# Patient Record
Sex: Male | Born: 1975 | Race: White | Hispanic: No | Marital: Married | Smoking: Current every day smoker
Health system: Southern US, Community
[De-identification: ages and names within clinical notes are randomized; demographics above are authoritative.]

## PROBLEM LIST (undated history)

## (undated) ENCOUNTER — Emergency Department (HOSPITAL_COMMUNITY): Payer: 59

## (undated) DIAGNOSIS — G43909 Migraine, unspecified, not intractable, without status migrainosus: Secondary | ICD-10-CM

---

## 2002-12-24 ENCOUNTER — Emergency Department (HOSPITAL_COMMUNITY): Admission: EM | Admit: 2002-12-24 | Discharge: 2002-12-24 | Payer: Self-pay | Admitting: Emergency Medicine

## 2005-03-09 ENCOUNTER — Emergency Department (HOSPITAL_COMMUNITY): Admission: EM | Admit: 2005-03-09 | Discharge: 2005-03-09 | Payer: Self-pay | Admitting: Emergency Medicine

## 2005-04-25 ENCOUNTER — Emergency Department (HOSPITAL_COMMUNITY): Admission: EM | Admit: 2005-04-25 | Discharge: 2005-04-25 | Payer: Self-pay | Admitting: Family Medicine

## 2007-01-26 IMAGING — CR DG FOOT COMPLETE 3+V*L*
4 series · 4 of 4 positions shown · non-contrast
Comparison: none

CLINICAL DATA: Left foot injury with pain and swelling.  
 3-VIEW LEFT FOOT:

[t foot ap left]
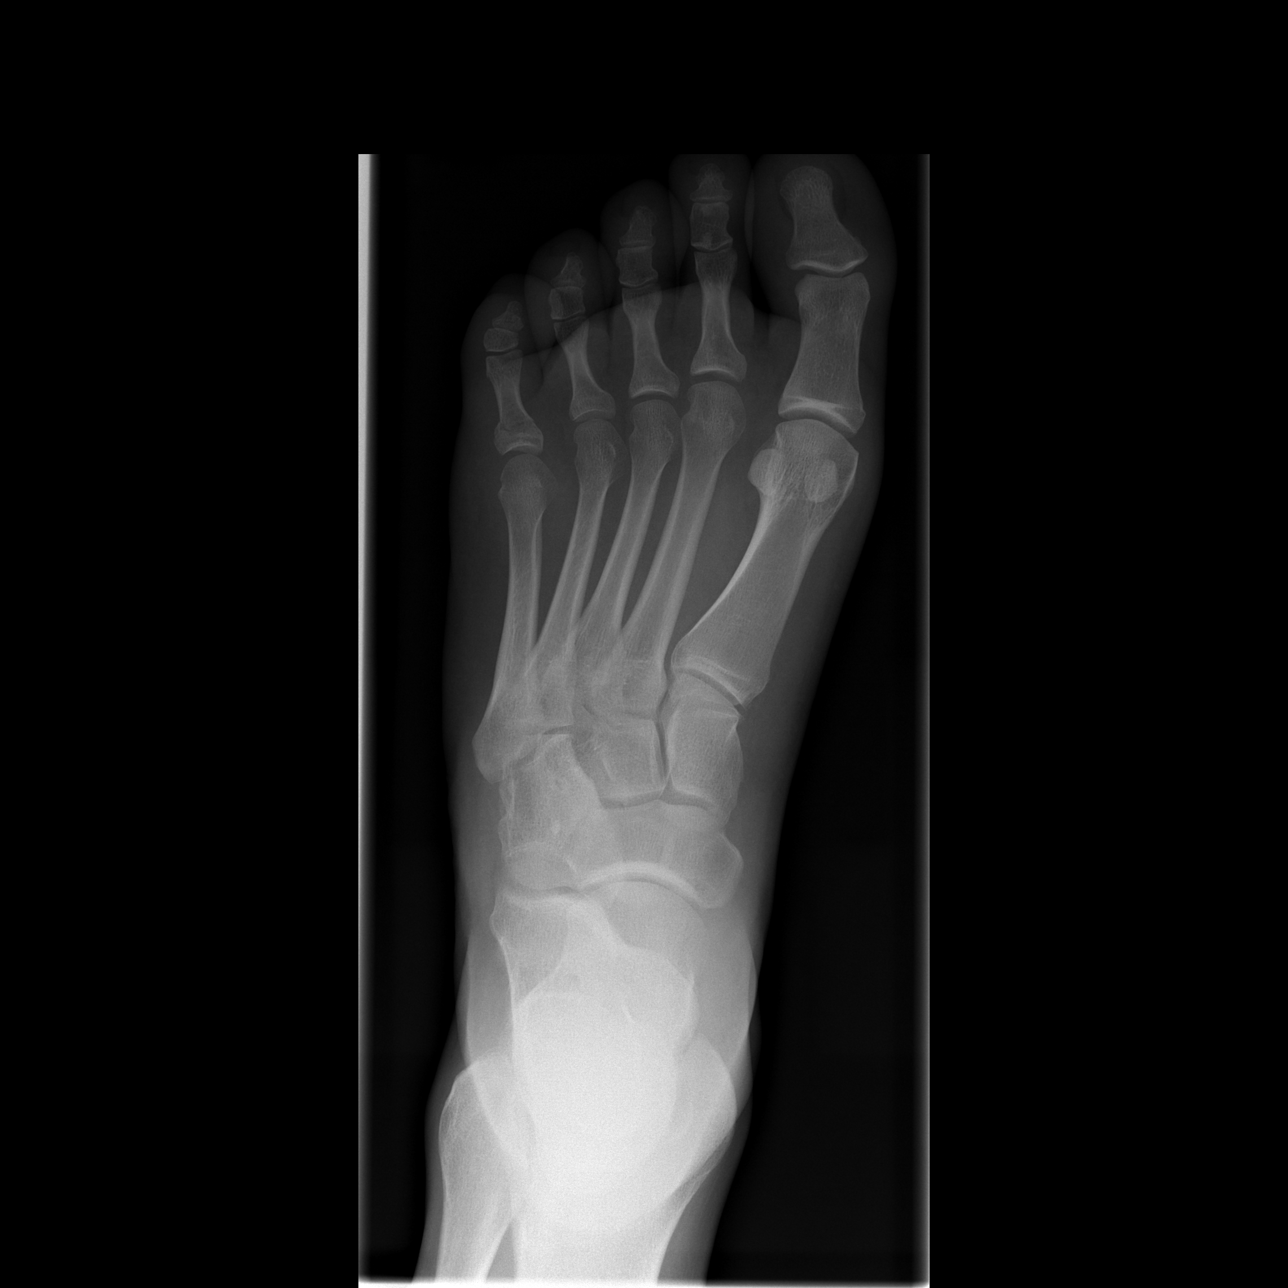

[t foot oblique left]
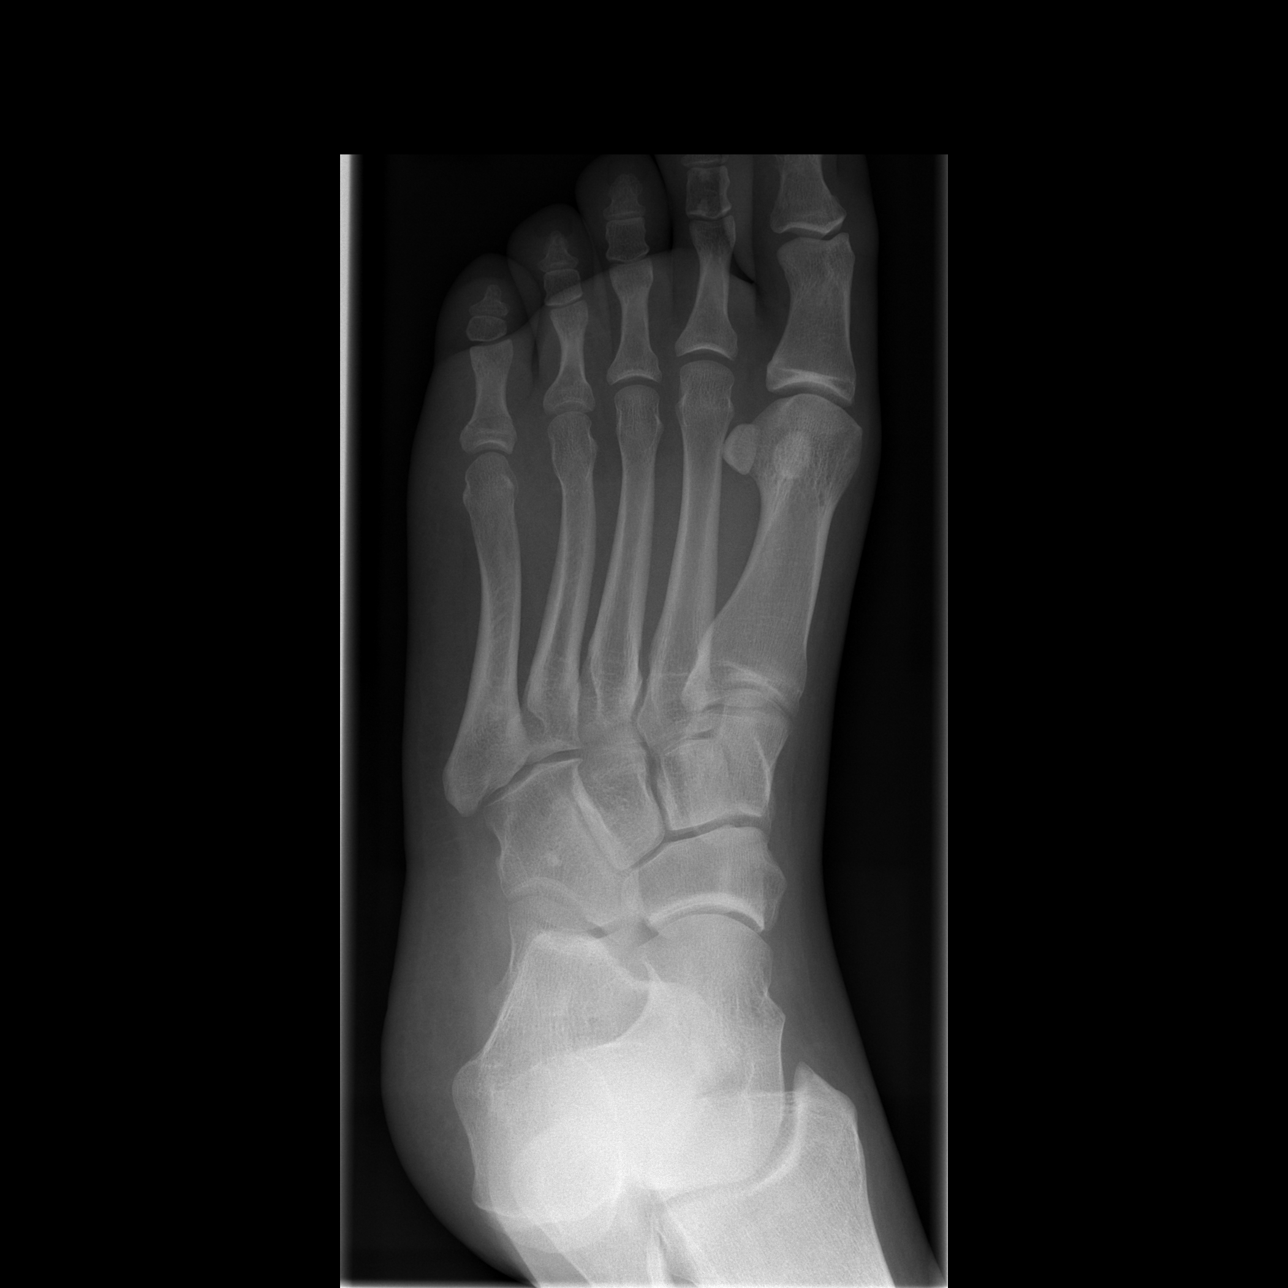

[t foot lat left (1 of 2)]
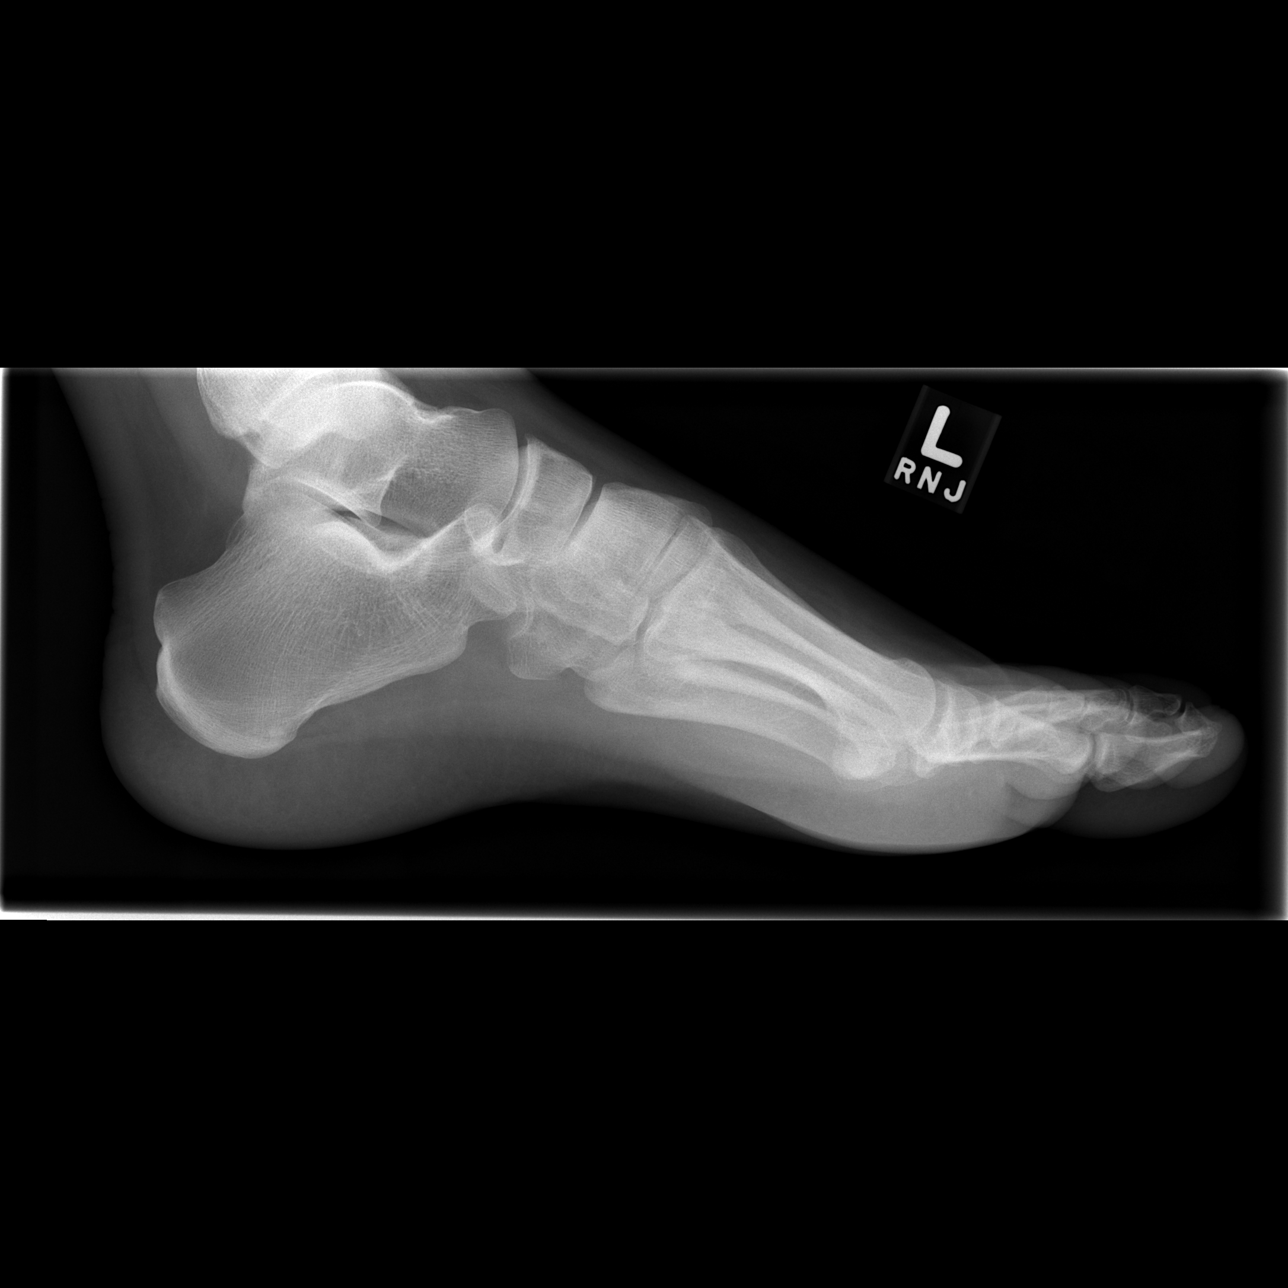

[t foot lat left (2 of 2)]
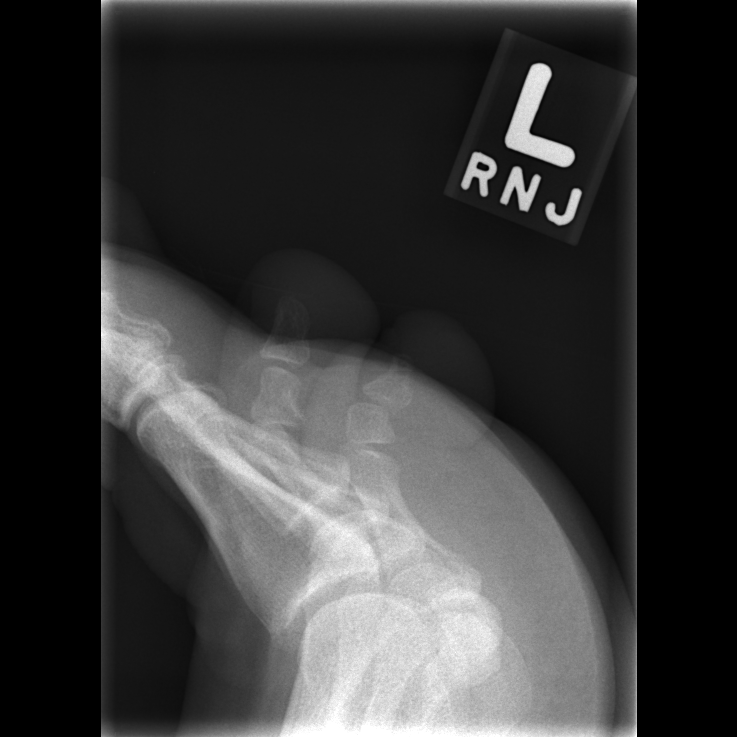

[4 of 4 positions shown; findings below may reference images not displayed]

FINDINGS: There is a nondisplaced fracture at the base of the proximal phalanx of the little toe.  There is no evidence of subluxation or dislocation.  No other significant abnormalities are identified.
IMPRESSION: Nondisplaced fracture of the base of the fifth proximal phalanx.

## 2007-09-24 ENCOUNTER — Emergency Department (HOSPITAL_BASED_OUTPATIENT_CLINIC_OR_DEPARTMENT_OTHER): Admission: EM | Admit: 2007-09-24 | Discharge: 2007-09-24 | Payer: Self-pay | Admitting: Emergency Medicine

## 2007-09-25 ENCOUNTER — Emergency Department (HOSPITAL_BASED_OUTPATIENT_CLINIC_OR_DEPARTMENT_OTHER): Admission: EM | Admit: 2007-09-25 | Discharge: 2007-09-25 | Payer: Self-pay | Admitting: Emergency Medicine

## 2008-11-15 ENCOUNTER — Emergency Department (HOSPITAL_BASED_OUTPATIENT_CLINIC_OR_DEPARTMENT_OTHER): Admission: EM | Admit: 2008-11-15 | Discharge: 2008-11-15 | Payer: Self-pay | Admitting: Emergency Medicine

## 2014-01-22 ENCOUNTER — Emergency Department (HOSPITAL_BASED_OUTPATIENT_CLINIC_OR_DEPARTMENT_OTHER)
Admission: EM | Admit: 2014-01-22 | Discharge: 2014-01-22 | Disposition: A | Discharge status: Home / Self Care | Payer: 59 | Attending: Emergency Medicine | Admitting: Emergency Medicine

## 2014-01-22 ENCOUNTER — Encounter (HOSPITAL_BASED_OUTPATIENT_CLINIC_OR_DEPARTMENT_OTHER): Payer: Self-pay | Admitting: *Deleted

## 2014-01-22 DIAGNOSIS — Z720 Tobacco use: Secondary | ICD-10-CM | POA: Insufficient documentation

## 2014-01-22 DIAGNOSIS — R05 Cough: Secondary | ICD-10-CM | POA: Insufficient documentation

## 2014-01-22 DIAGNOSIS — Z79899 Other long term (current) drug therapy: Secondary | ICD-10-CM | POA: Diagnosis not present

## 2014-01-22 DIAGNOSIS — G43809 Other migraine, not intractable, without status migrainosus: Secondary | ICD-10-CM | POA: Insufficient documentation

## 2014-01-22 DIAGNOSIS — G43909 Migraine, unspecified, not intractable, without status migrainosus: Secondary | ICD-10-CM | POA: Diagnosis present

## 2014-01-22 HISTORY — DX: Migraine, unspecified, not intractable, without status migrainosus: G43.909

## 2014-01-22 MED ORDER — SODIUM CHLORIDE 0.9 % IV BOLUS (SEPSIS)
1000.0000 mL | Freq: Once | INTRAVENOUS | Status: AC
  Administered 2014-01-22: 1000 mL via INTRAVENOUS

## 2014-01-22 MED ORDER — PROMETHAZINE HCL 25 MG/ML IJ SOLN
12.5000 mg | Freq: Once | INTRAMUSCULAR | Status: AC
  Administered 2014-01-22: 12.5 mg via INTRAVENOUS
  Filled 2014-01-22: qty 1

## 2014-01-22 MED ORDER — SODIUM CHLORIDE 0.9 % IV SOLN: INTRAVENOUS | Status: DC

## 2014-01-22 MED ORDER — DIPHENHYDRAMINE HCL 50 MG/ML IJ SOLN
25.0000 mg | Freq: Once | INTRAMUSCULAR | Status: AC
  Administered 2014-01-22: 25 mg via INTRAVENOUS
  Filled 2014-01-22: qty 1

## 2014-01-22 MED ORDER — DEXAMETHASONE SODIUM PHOSPHATE 10 MG/ML IJ SOLN
10.0000 mg | Freq: Once | INTRAMUSCULAR | Status: AC
  Administered 2014-01-22: 10 mg via INTRAVENOUS
  Filled 2014-01-22: qty 1

## 2014-01-22 MED ORDER — KETOROLAC TROMETHAMINE 30 MG/ML IJ SOLN
30.0000 mg | Freq: Once | INTRAMUSCULAR | Status: AC
Start: 2014-01-22 — End: 2014-01-22
  Administered 2014-01-22: 30 mg via INTRAVENOUS
  Filled 2014-01-22: qty 1

## 2014-01-22 NOTE — ED Notes (Signed)
Migraine headache since last night. Light and noise sensitive.

## 2014-01-22 NOTE — Discharge Instructions (Signed)
Migraine Headache A migraine headache is very bad, throbbing pain on one or both sides of your head. Talk to your doctor about what things may bring on (trigger) your migraine headaches. HOME CARE  Only take medicines as told by your doctor.  Lie down in a dark, quiet room when you have a migraine.  Keep a journal to find out if certain things bring on migraine headaches. For example, write down:  What you eat and drink.  How much sleep you get.  Any change to your diet or medicines.  Lessen how much alcohol you drink.  Quit smoking if you smoke.  Get enough sleep.  Lessen any stress in your life.  Keep lights dim if bright lights bother you or make your migraines worse. GET HELP RIGHT AWAY IF:   Your migraine becomes really bad.  You have a fever.  You have a stiff neck.  You have trouble seeing.  Your muscles are weak, or you lose muscle control.  You lose your balance or have trouble walking.  You feel like you will pass out (faint), or you pass out.  You have really bad symptoms that are different than your first symptoms. MAKE SURE YOU:   Understand these instructions.  Will watch your condition.  Will get help right away if you are not doing well or get worse. Document Released: 11/25/2007 Document Revised: 05/10/2011 Document Reviewed: 10/23/2012 [facility]ExitCare Patient Information 2015 [place], [place]. This information is not intended to replace advice given to you by your health care provider. Make sure you discuss any questions you have with your health care provider.  Rest and work note provided. Return for any new or worse symptoms. Can resume your migraine medicines at home. Make an appoint follow up with your regular Dr. if symptoms persist.

## 2014-01-22 NOTE — ED Provider Notes (Signed)
CSN: [id number]     Arrival date & time 01/22/14  1851 History  This chart was scribed for Corey Gutierrez Signora Zucco, MD by Annye AsaAnna Dorsett, ED Scribe. This patient was seen in room MH09/MH09 and the patient's care was started at 8:29 PM.    Chief Complaint  Patient presents with  . Migraine   Patient is a 38 y.o. male presenting with migraines. The history is provided by the patient and the spouse. No language interpreter was used.  Migraine This is a chronic problem. The current episode started yesterday. The problem has not changed since onset.Associated symptoms include headaches. Pertinent negatives include no chest pain and no abdominal pain. He has tried nothing for the symptoms.     HPI Comments: Corey CassisJason Gutierrez is a 38 y.o. male with past medical history of migraines presents to the Emergency Department complaining of migraine, beginning last night at 2000. He reports that his pain is throbbing in nature and localized behind his left eye. He reports a history of migraines, generally located behind the right eye. He reports nausea, vomiting, photophobia.   Past Medical History  Diagnosis Date  . Migraine headache    History reviewed. No pertinent past surgical history. No family history on file. History  Substance Use Topics  . Smoking status: Current Every Day Smoker -- 0.50 packs/day    Types: Cigarettes  . Smokeless tobacco: Not on file  . Alcohol Use: No    Review of Systems  Constitutional: Negative for fever and chills.  HENT: Negative for congestion, rhinorrhea and sore throat.   Eyes: Positive for photophobia and visual disturbance.  Respiratory: Positive for cough.   Cardiovascular: Negative for chest pain and leg swelling.  Gastrointestinal: Positive for nausea and vomiting. Negative for abdominal pain and diarrhea.  Genitourinary: Negative for dysuria, frequency and hematuria.  Musculoskeletal: Negative for back pain.  Skin: Negative for rash.  Neurological: Positive for  headaches. Negative for weakness and numbness.  Hematological: Does not bruise/bleed easily.  Psychiatric/Behavioral: Negative for confusion.   Allergies  Review of patient's allergies indicates no known allergies.  Home Medications   Prior to Admission medications   Medication Sig Start Date End Date Taking? Authorizing Provider  Venlafaxine HCl (EFFEXOR PO) Take by mouth.   Yes Historical Provider, MD   BP 133/72 mmHg  Pulse 76  Temp(Src) 98.2 F (36.8 C) (Oral)  Resp 20  Ht 6' (1.829 m)  Wt 190 lb (86.183 kg)  BMI 25.76 kg/m2  SpO2 99% Physical Exam  Constitutional: He is oriented to person, place, and time. He appears well-developed and well-nourished.  HENT:  Head: Normocephalic and atraumatic.  Eyes: Conjunctivae and EOM are normal. Pupils are equal, round, and reactive to light.  Neck: Neck supple. No tracheal deviation present.  Cardiovascular: Normal rate, regular rhythm and normal heart sounds.  Exam reveals no gallop and no friction rub.   No murmur heard. Pulmonary/Chest: Effort normal and breath sounds normal. He has no wheezes. He has no rales.  Abdominal: Soft. Bowel sounds are normal. There is no tenderness.  Musculoskeletal: He exhibits no edema.  Neurological: He is alert and oriented to person, place, and time.  Skin: Skin is warm and dry.  Psychiatric: He has a normal mood and affect. His behavior is normal.  Nursing note and vitals reviewed.   ED Course  Procedures   DIAGNOSTIC STUDIES: Oxygen Saturation is 100% on RA, normal by my interpretation.    COORDINATION OF CARE: 8:31 PM Discussed treatment plan with  pt at bedside and pt agreed to plan.   Labs Review Labs Reviewed - No data to display  Imaging Review No results found.   EKG Interpretation None      MDM   Final diagnoses:  Other type of migraine   Patient with history of migraines. Patient has chronic migraine meds at home but it was vomiting and could not keep them down.  Headache since yesterday. Associated with nausea vomiting and light sensitivity. Typical for his migraines other than the location is behind the left eye when normally his right eye. Patient got to make migraine cocktail and hydration here. Patient states no real improvement but he was able to rest. Patient clinically seems to be showing signs of improvement. Patient nontoxic, no acute distress no fevers. Patient we discharged home with rest. And can resume his migraine meds. Work note provided.    I personally performed the services described in this documentation, which was scribed in my presence. The recorded information has been reviewed and is accurate.       Corey Gutierrez Danilynn Jemison, MD 01/22/14 2201

## 2014-03-12 ENCOUNTER — Emergency Department (HOSPITAL_BASED_OUTPATIENT_CLINIC_OR_DEPARTMENT_OTHER)
Admission: EM | Admit: 2014-03-12 | Discharge: 2014-03-12 | Disposition: A | Discharge status: Home / Self Care | Payer: 59 | Attending: Emergency Medicine | Admitting: Emergency Medicine

## 2014-03-12 ENCOUNTER — Encounter (HOSPITAL_BASED_OUTPATIENT_CLINIC_OR_DEPARTMENT_OTHER): Payer: Self-pay

## 2014-03-12 DIAGNOSIS — Z79899 Other long term (current) drug therapy: Secondary | ICD-10-CM | POA: Insufficient documentation

## 2014-03-12 DIAGNOSIS — M545 Low back pain: Secondary | ICD-10-CM | POA: Diagnosis present

## 2014-03-12 DIAGNOSIS — Z720 Tobacco use: Secondary | ICD-10-CM | POA: Diagnosis not present

## 2014-03-12 DIAGNOSIS — G43909 Migraine, unspecified, not intractable, without status migrainosus: Secondary | ICD-10-CM | POA: Diagnosis not present

## 2014-03-12 DIAGNOSIS — M5431 Sciatica, right side: Secondary | ICD-10-CM | POA: Diagnosis not present

## 2014-03-12 MED ORDER — OXYCODONE-ACETAMINOPHEN 5-325 MG PO TABS: 1.0000 | ORAL_TABLET | ORAL | Status: AC | PRN

## 2014-03-12 MED ORDER — OXYCODONE-ACETAMINOPHEN 5-325 MG PO TABS
2.0000 | ORAL_TABLET | Freq: Once | ORAL | Status: AC
  Administered 2014-03-12: 2 via ORAL
  Filled 2014-03-12: qty 2

## 2014-03-12 MED ORDER — CYCLOBENZAPRINE HCL 10 MG PO TABS: 10.0000 mg | ORAL_TABLET | Freq: Two times a day (BID) | ORAL | Status: DC | PRN

## 2014-03-12 NOTE — Discharge Instructions (Signed)
Back Exercises Back exercises help treat and prevent back injuries. The goal of back exercises is to increase the strength of your abdominal and back muscles and the flexibility of your back. These exercises should be started when you no longer have back pain. Back exercises include:  Pelvic Tilt. Lie on your back with your knees bent. Tilt your pelvis until the lower part of your back is against the floor. Hold this position 5 to 10 sec and repeat 5 to 10 times.  Knee to Chest. Pull first 1 knee up against your chest and hold for 20 to 30 seconds, repeat this with the other knee, and then both knees. This may be done with the other leg straight or bent, whichever feels better.  Sit-Ups or Curl-Ups. Bend your knees 90 degrees. Start with tilting your pelvis, and do a partial, slow sit-up, lifting your trunk only 30 to 45 degrees off the floor. Take at least 2 to 3 seconds for each sit-up. Do not do sit-ups with your knees out straight. If partial sit-ups are difficult, simply do the above but with only tightening your abdominal muscles and holding it as directed.  Hip-Lift. Lie on your back with your knees flexed 90 degrees. Push down with your feet and shoulders as you raise your hips a couple inches off the floor; hold for 10 seconds, repeat 5 to 10 times.  Back arches. Lie on your stomach, propping yourself up on bent elbows. Slowly press on your hands, causing an arch in your low back. Repeat 3 to 5 times. Any initial stiffness and discomfort should lessen with repetition over time.  Shoulder-Lifts. Lie face down with arms beside your body. Keep hips and torso pressed to floor as you slowly lift your head and shoulders off the floor. Do not overdo your exercises, especially in the beginning. Exercises may cause you some mild back discomfort which lasts for a few minutes; however, if the pain is more severe, or lasts for more than 15 minutes, do not continue exercises until you see your caregiver.  Improvement with exercise therapy for back problems is slow.  See your caregivers for assistance with developing a proper back exercise program. Document Released: 03/25/2004 Document Revised: 05/10/2011 Document Reviewed: 12/17/2010 [employer] Patient Information 2015 [employer]. This information is not intended to replace advice given to you by your health care provider. Make sure you discuss any questions you have with your health care provider.   Sciatica Sciatica is pain, weakness, numbness, or tingling along the path of the sciatic nerve. The nerve starts in the lower back and runs down the back of each leg. The nerve controls the muscles in the lower leg and in the back of the knee, while also providing sensation to the back of the thigh, lower leg, and the sole of your foot. Sciatica is a symptom of another medical condition. For instance, nerve damage or certain conditions, such as a herniated disk or bone spur on the spine, pinch or put pressure on the sciatic nerve. This causes the pain, weakness, or other sensations normally associated with sciatica. Generally, sciatica only affects one side of the body. CAUSES   Herniated or slipped disc.  Degenerative disk disease.  A pain disorder involving the narrow muscle in the buttocks (piriformis syndrome).  Pelvic injury or fracture.  Pregnancy.  Tumor (rare). SYMPTOMS  Symptoms can vary from mild to very severe. The symptoms usually travel from the low back to the buttocks and down the back   of the leg. Symptoms can include:  Mild tingling or dull aches in the lower back, leg, or hip.  Numbness in the back of the calf or sole of the foot.  Burning sensations in the lower back, leg, or hip.  Sharp pains in the lower back, leg, or hip.  Leg weakness.  Severe back pain inhibiting movement. These symptoms may get worse with coughing, sneezing, laughing, or prolonged sitting or standing. Also, being overweight may worsen  symptoms. DIAGNOSIS  Your caregiver will perform a physical exam to look for common symptoms of sciatica. He or she may ask you to do certain movements or activities that would trigger sciatic nerve pain. Other tests may be performed to find the cause of the sciatica. These may include:  Blood tests.  X-rays.  Imaging tests, such as an MRI or CT scan. TREATMENT  Treatment is directed at the cause of the sciatic pain. Sometimes, treatment is not necessary and the pain and discomfort goes away on its own. If treatment is needed, your caregiver may suggest:  Over-the-counter medicines to relieve pain.  Prescription medicines, such as anti-inflammatory medicine, muscle relaxants, or narcotics.  Applying heat or ice to the painful area.  Steroid injections to lessen pain, irritation, and inflammation around the nerve.  Reducing activity during periods of pain.  Exercising and stretching to strengthen your abdomen and improve flexibility of your spine. Your caregiver may suggest losing weight if the extra weight makes the back pain worse.  Physical therapy.  Surgery to eliminate what is pressing or pinching the nerve, such as a bone spur or part of a herniated disk. HOME CARE INSTRUCTIONS   Only take over-the-counter or prescription medicines for pain or discomfort as directed by your caregiver.  Apply ice to the affected area for 20 minutes, 3-4 times a day for the first 48-72 hours. Then try heat in the same way.  Exercise, stretch, or perform your usual activities if these do not aggravate your pain.  Attend physical therapy sessions as directed by your caregiver.  Keep all follow-up appointments as directed by your caregiver.  Do not wear high heels or shoes that do not provide proper support.  Check your mattress to see if it is too soft. A firm mattress may lessen your pain and discomfort. SEEK IMMEDIATE MEDICAL CARE IF:   You lose control of your bowel or bladder  (incontinence).  You have increasing weakness in the lower back, pelvis, buttocks, or legs.  You have redness or swelling of your back.  You have a burning sensation when you urinate.  You have pain that gets worse when you lie down or awakens you at night.  Your pain is worse than you have experienced in the past.  Your pain is lasting longer than 4 weeks.  You are suddenly losing weight without reason. MAKE SURE YOU:  Understand these instructions.  Will watch your condition.  Will get help right away if you are not doing well or get worse. Document Released: 02/09/2001 Document Revised: 08/17/2011 Document Reviewed: 06/27/2011 Exit[employer] Patient Information 2015 [employer]. This information is not intended to replace advice given to you by your health care provider. Make sure you discuss any questions you have with your health care provider.  

## 2014-03-12 NOTE — ED Provider Notes (Signed)
CSN: [id number]     Arrival date & time 03/12/14  2038 History  This chart was scribed for Corey Mo, MD by Roxy Cedar, ED Scribe. This patient was seen in room MHT13/MHT13 and the patient's care was started at 10:44 PM.   Chief Complaint  Patient presents with  . Back Pain   Patient is a 39 y.o. male presenting with back pain. The history is provided by the patient. No language interpreter was used.  Back Pain Location:  Sacro-iliac joint, lumbar spine and gluteal region Quality:  Aching and shooting Radiates to:  Does not radiate Pain severity:  Moderate Onset quality:  Gradual Duration:  3 days  HPI Comments: Claud Gutierrez is a 39 y.o. male with a PMHx of migraine headache, who presents to the Emergency Department complaining of moderate right sided lower back pain that began 3 nights ago. He states that the pain is radiating down to his gluteus muscle. He states he took  ibuprofen and applied patches with mild relief. He states that the pain worsens if he walks more than a few steps. He states that the pain is constant and felt the most when sitting in one position for extended amounts of time. He states he had similar back pain in the past but not as severe as today. He reports associated mild numbness and tingling. He denies associated dysuria. Patient is a smoker.  Past Medical History  Diagnosis Date  . Migraine headache    History reviewed. No pertinent past surgical history. No family history on file. History  Substance Use Topics  . Smoking status: Current Every Day Smoker -- 0.50 packs/day    Types: Cigarettes  . Smokeless tobacco: Not on file  . Alcohol Use: No   Review of Systems  Musculoskeletal: Positive for back pain.  All other systems reviewed and are negative.  Allergies  Review of patient's allergies indicates no known allergies.  Home Medications   Prior to Admission medications   Medication Sig Start Date End Date Taking? Authorizing  Provider  DIAZEPAM PO Take by mouth.   Yes Historical Provider, MD  Famotidine (PEPCID PO) Take by mouth.   Yes Historical Provider, MD  SUMAtriptan Succinate (IMITREX PO) Take by mouth.   Yes Historical Provider, MD  cyclobenzaprine (FLEXERIL) 10 MG tablet Take 1 tablet (10 mg total) by mouth 2 (two) times daily as needed for muscle spasms. 03/12/14   Corey Mo, MD  oxyCODONE-acetaminophen (PERCOCET/ROXICET) 5-325 MG per tablet Take 1-2 tablets by mouth every 4 (four) hours as needed for severe pain. 03/12/14   Corey Mo, MD  Venlafaxine HCl (EFFEXOR PO) Take by mouth.    Historical Provider, MD   Triage Vitals: BP 130/96 mmHg  Pulse 89  Temp(Src) 97.8 F (36.6 C) (Oral)  Resp 16  Ht 6' (1.829 m)  Wt 190 lb (86.183 kg)  BMI 25.76 kg/m2  SpO2 98%  Physical Exam  Constitutional: He is oriented to person, place, and time. He appears well-developed and well-nourished.  HENT:  Head: Normocephalic and atraumatic.  Eyes: Conjunctivae and EOM are normal.  Neck: Normal range of motion. Neck supple.  Cardiovascular: Normal rate, regular rhythm and normal heart sounds.   Pulmonary/Chest: Effort normal and breath sounds normal. No respiratory distress.  Abdominal: He exhibits no distension. There is no tenderness. There is no rebound and no guarding.  Musculoskeletal: Normal range of motion.       Cervical back: Normal.       Thoracic back: Normal.  Lumbar back: He exhibits tenderness (R paraspinal). He exhibits no bony tenderness.  Neurological: He is alert and oriented to person, place, and time.  No sensory loss or weakness of bil le  Skin: Skin is warm and dry.  Nursing note and vitals reviewed.  ED Course  Procedures (including critical care time)  DIAGNOSTIC STUDIES: Oxygen Saturation is 98% on RA, normal by my interpretation.    COORDINATION OF CARE: 10:49 PM- Discussed plans to discharge. Will give patient percocet/roxicet 5-325mg . Pt advised of plan for treatment  and pt agrees.  Labs Review Labs Reviewed - No data to display  Imaging Review Dg Lumbar Spine 2-3 Views  03/13/2014   CLINICAL DATA:  Several days of low back and right hip pain  EXAM: LUMBAR SPINE - 2-3 VIEW  COMPARISON:  None.  FINDINGS: The lumbar vertebral bodies are preserved in height. There is mild disc space narrowing at L4-5. There is no spondylolisthesis. The pedicles and transverse processes are intact. There is no significant facet joint hypertrophy. The observed portions of the sacrum are unremarkable.  IMPRESSION: There is mild disc space narrowing at L4-5. Otherwise the examination is within the limits of normal.   Electronically Signed   By: David  Swaziland[place]   On: 03/13/2014 16:01     EKG Interpretation None     MDM   Final diagnoses:  Sciatica, right    39 y.o. male with pertinent PMH of prior back pain, migraines presents with  Right-sided back pain radiating down his leg. On arrival vital signs and physical exam as above. Physical exam consistent with sciatica. No concerning historical exam features of cauda equina.   Given strict return precautions, voiced understanding and agreed to follow-up..    I have reviewed all laboratory and imaging studies if ordered as above  1. Sciatica, right       I personally performed the services described in this documentation, which was scribed in my presence. The recorded information has been reviewed and is accurate.  Corey MoMatthew Gentry, MD 03/13/14 1900

## 2014-03-12 NOTE — ED Notes (Signed)
C/o lower back pain started Sunday night-denies injury

## 2014-03-13 ENCOUNTER — Other Ambulatory Visit: Payer: Self-pay | Admitting: Nurse Practitioner

## 2014-03-13 ENCOUNTER — Ambulatory Visit
Admission: RE | Admit: 2014-03-13 | Discharge: 2014-03-13 | Disposition: A | Discharge status: Home / Self Care | Payer: 59 | Source: Ambulatory Visit | Attending: Nurse Practitioner | Admitting: Nurse Practitioner

## 2014-03-13 DIAGNOSIS — M549 Dorsalgia, unspecified: Secondary | ICD-10-CM

## 2014-04-20 ENCOUNTER — Encounter (HOSPITAL_BASED_OUTPATIENT_CLINIC_OR_DEPARTMENT_OTHER): Payer: Self-pay | Admitting: *Deleted

## 2014-04-20 ENCOUNTER — Emergency Department (HOSPITAL_BASED_OUTPATIENT_CLINIC_OR_DEPARTMENT_OTHER)
Admission: EM | Admit: 2014-04-20 | Discharge: 2014-04-20 | Disposition: A | Discharge status: Home / Self Care | Payer: 59 | Attending: Emergency Medicine | Admitting: Emergency Medicine

## 2014-04-20 DIAGNOSIS — Z79899 Other long term (current) drug therapy: Secondary | ICD-10-CM | POA: Insufficient documentation

## 2014-04-20 DIAGNOSIS — Z720 Tobacco use: Secondary | ICD-10-CM | POA: Diagnosis not present

## 2014-04-20 DIAGNOSIS — G43909 Migraine, unspecified, not intractable, without status migrainosus: Secondary | ICD-10-CM | POA: Diagnosis present

## 2014-04-20 DIAGNOSIS — G43809 Other migraine, not intractable, without status migrainosus: Secondary | ICD-10-CM | POA: Insufficient documentation

## 2014-04-20 MED ORDER — MORPHINE SULFATE 2 MG/ML IJ SOLN
2.0000 mg | Freq: Once | INTRAMUSCULAR | Status: AC
Start: 2014-04-20 — End: 2014-04-20
  Administered 2014-04-20: 2 mg via INTRAVENOUS
  Filled 2014-04-20: qty 1

## 2014-04-20 MED ORDER — HALOPERIDOL LACTATE 5 MG/ML IJ SOLN
2.0000 mg | Freq: Once | INTRAMUSCULAR | Status: AC
  Administered 2014-04-20: 2 mg via INTRAVENOUS
  Filled 2014-04-20: qty 1

## 2014-04-20 MED ORDER — KETOROLAC TROMETHAMINE 30 MG/ML IJ SOLN
30.0000 mg | Freq: Once | INTRAMUSCULAR | Status: AC
  Administered 2014-04-20: 30 mg via INTRAVENOUS
  Filled 2014-04-20: qty 1

## 2014-04-20 MED ORDER — SODIUM CHLORIDE 0.9 % IV BOLUS (SEPSIS)
1000.0000 mL | Freq: Once | INTRAVENOUS | Status: AC
  Administered 2014-04-20: 1000 mL via INTRAVENOUS

## 2014-04-20 MED ORDER — MORPHINE SULFATE 4 MG/ML IJ SOLN
4.0000 mg | Freq: Once | INTRAMUSCULAR | Status: AC
  Administered 2014-04-20: 4 mg via INTRAVENOUS
  Filled 2014-04-20: qty 1

## 2014-04-20 MED ORDER — METOCLOPRAMIDE HCL 5 MG/ML IJ SOLN
10.0000 mg | Freq: Once | INTRAMUSCULAR | Status: AC
  Administered 2014-04-20: 10 mg via INTRAVENOUS
  Filled 2014-04-20: qty 2

## 2014-04-20 MED ORDER — DIPHENHYDRAMINE HCL 50 MG/ML IJ SOLN
25.0000 mg | Freq: Once | INTRAMUSCULAR | Status: AC
  Administered 2014-04-20: 25 mg via INTRAVENOUS
  Filled 2014-04-20: qty 1

## 2014-04-20 NOTE — ED Notes (Signed)
Severe migraine with sensitivity to light and sound.  Pt with hx of same, no fever or neck stiffness wit this

## 2014-04-20 NOTE — ED Provider Notes (Signed)
CSN: [id number]     Arrival date & time 04/20/14  1436 History  This chart was scribed for Toy Cookey, MD by Jarvis Morgan, ED Scribe. This patient was seen in room MH10/MH10 and the patient's care was started at 3:42 PM.    Chief Complaint  Patient presents with  . Migraine    Patient is a 39 y.o. male presenting with migraines. The history is provided by the patient and the spouse. No language interpreter was used.  Migraine This is a chronic problem. The current episode started yesterday. The problem has been gradually worsening. Associated symptoms include headaches. Pertinent negatives include no chest pain, no abdominal pain and no shortness of breath. Exacerbated by: light and noises. Nothing relieves the symptoms. Treatments tried: Imitrex. The treatment provided no relief.    HPI Comments: Corey Gutierrez is a 39 y.o. male who presents to the Emergency Department complaining of a constant, "burning and stabbing" migraine for 1 day. Pt's wife states that he has been complaining of the migraine being localized on his left side and radiates into his left eye. He has had associated nausea, vomiting, photophobia and noise sensitivty. The headache came on gradually and has been progressively worsening. Wife states he used to get migraines 2-3 times per year but when it gets worse it can sometimes come 1x per week. He has seen a neurologist in the past for his migraines. He takes Imitrex for his migraines and his last dose was yesterday. Pt states that this migraine feels like migraines he has had in the past. He has been eating and drinking normally. Wife states he is sleeping a little less than normal. He has had to come to the ED for a migraine in the past. Wife states that the migraines seem to get worse with changes in weather. Pt denies any recent falls or injuries. He also denies any fever, chills, numbness, weakness or vision changes.    Past Medical History  Diagnosis Date  . Migraine  headache    History reviewed. No pertinent past surgical history. No family history on file. History  Substance Use Topics  . Smoking status: Current Every Day Smoker -- 0.50 packs/day    Types: Cigarettes  . Smokeless tobacco: Not on file  . Alcohol Use: No    Review of Systems  Constitutional: Negative for fever, activity change, appetite change and fatigue.  HENT: Negative for congestion, facial swelling, rhinorrhea and trouble swallowing.   Eyes: Positive for photophobia. Negative for pain.  Respiratory: Negative for cough, chest tightness and shortness of breath.   Cardiovascular: Negative for chest pain and leg swelling.  Gastrointestinal: Positive for nausea and vomiting. Negative for abdominal pain, diarrhea and constipation.  Endocrine: Negative for polydipsia and polyuria.  Genitourinary: Negative for dysuria, urgency, decreased urine volume and difficulty urinating.  Musculoskeletal: Negative for back pain and gait problem.  Skin: Negative for color change, rash and wound.  Allergic/Immunologic: Negative for immunocompromised state.  Neurological: Positive for headaches. Negative for dizziness, facial asymmetry, speech difficulty, weakness and numbness.  Psychiatric/Behavioral: Negative for confusion, decreased concentration and agitation.      Allergies  Review of patient's allergies indicates no known allergies.  Home Medications   Prior to Admission medications   Medication Sig Start Date End Date Taking? Authorizing Provider  cyclobenzaprine (FLEXERIL) 10 MG tablet Take 1 tablet (10 mg total) by mouth 2 (two) times daily as needed for muscle spasms. 03/12/14   Mirian Mo, MD  DIAZEPAM PO Take by  mouth.    Historical Provider, MD  Famotidine (PEPCID PO) Take by mouth.    Historical Provider, MD  oxyCODONE-acetaminophen (PERCOCET/ROXICET) 5-325 MG per tablet Take 1-2 tablets by mouth every 4 (four) hours as needed for severe pain. 03/12/14   Mirian MoMatthew Gentry, MD   SUMAtriptan Succinate (IMITREX PO) Take by mouth.    Historical Provider, MD  Venlafaxine HCl (EFFEXOR PO) Take by mouth.    Historical Provider, MD   Triage Vitals: BP 122/85 mmHg  Pulse 78  Temp(Src) 97.8 F (36.6 C) (Oral)  Resp 22  Ht 6' (1.829 m)  Wt 185 lb (83.915 kg)  BMI 25.08 kg/m2  SpO2 97%  Physical Exam  Constitutional: He is oriented to person, place, and time. He appears well-developed and well-nourished. No distress.  Appears uncomfortable  HENT:  Head: Normocephalic and atraumatic.  Mouth/Throat: No oropharyngeal exudate.  Eyes: Pupils are equal, round, and reactive to light.  Neck: Normal range of motion. Neck supple.  Cardiovascular: Normal rate, regular rhythm and normal heart sounds.  Exam reveals no gallop and no friction rub.   No murmur heard. Pulmonary/Chest: Effort normal and breath sounds normal. No respiratory distress. He has no wheezes. He has no rales.  Abdominal: Soft. Bowel sounds are normal. He exhibits no distension and no mass. There is no tenderness. There is no rebound and no guarding.  Musculoskeletal: Normal range of motion. He exhibits no edema or tenderness.  Neurological: He is alert and oriented to person, place, and time. He has normal strength. He displays no atrophy and no tremor. No cranial nerve deficit or sensory deficit. He exhibits normal muscle tone. He displays no seizure activity. Coordination and gait normal. GCS eye subscore is 4. GCS verbal subscore is 5. GCS motor subscore is 6.  Skin: Skin is warm and dry.  Psychiatric: He has a normal mood and affect.    ED Course  Procedures (including critical care time)  DIAGNOSTIC STUDIES: Oxygen Saturation is 97% on RA, normal by my interpretation.    COORDINATION OF CARE:    Labs Review Labs Reviewed - No data to display  Imaging Review No results found.   EKG Interpretation None      MDM   Final diagnoses:  Other migraine without status migrainosus, not  intractable    Pt is a 39 y.o. male with Pmhx as above who presents with L sided headache with associated photo phonophobia, nausea, vomiting, consistent with prior migraines.  He states he took one dose of Imitrex yesterday that was his last dose.  He has been having about 1 migraine a week.  They're not typically this bad.  Headache is gradual onset worsening.  He's had no associated fevers, chills, neck stiffness, numbness, weakness, confusion or visual changes.  On physical exam, patient appears uncomfortable but has normal neurologic exam including ambulation.  I will treat with a migraine cocktail and reexamined.  History of present illness physical exam are not consistent with meningitis, CVA/TIA or subarachnoid hemorrhage.  Pain was 7/10, now 3-4/1- after migraine cocktail. Pt appears restless, wife & pt state this is his usual with migraines.   Patient feeling much improved after 2 mg IV Haldol and additional 2 mg IV morphine.  We'll discharge home.  Pietro CassisJason Badman evaluation in the Emergency Department is complete. It has been determined that no acute conditions requiring further emergency intervention are present at this time. The patient/guardian have been advised of the diagnosis and plan. We have discussed signs and symptoms  that warrant return to the ED, such as changes or worsening in symptoms, worsening pain, fever, neck stiffness, numbness or weakness   I personally performed the services described in this documentation, which was scribed in my presence. The recorded information has been reviewed and is accurate.      Toy Cookey, MD 04/20/14 [phone]

## 2014-04-20 NOTE — Discharge Instructions (Signed)

## 2014-10-22 ENCOUNTER — Other Ambulatory Visit: Payer: Self-pay | Admitting: Nurse Practitioner

## 2014-10-22 ENCOUNTER — Ambulatory Visit
Admission: RE | Admit: 2014-10-22 | Discharge: 2014-10-22 | Disposition: A | Discharge status: Home / Self Care | Payer: 59 | Source: Ambulatory Visit | Attending: Nurse Practitioner | Admitting: Nurse Practitioner

## 2014-10-22 DIAGNOSIS — J069 Acute upper respiratory infection, unspecified: Secondary | ICD-10-CM

## 2015-02-16 ENCOUNTER — Emergency Department (HOSPITAL_BASED_OUTPATIENT_CLINIC_OR_DEPARTMENT_OTHER)
Admission: EM | Admit: 2015-02-16 | Discharge: 2015-02-16 | Disposition: A | Discharge status: Home / Self Care | Payer: 59 | Attending: Emergency Medicine | Admitting: Emergency Medicine

## 2015-02-16 ENCOUNTER — Encounter (HOSPITAL_BASED_OUTPATIENT_CLINIC_OR_DEPARTMENT_OTHER): Payer: Self-pay | Admitting: *Deleted

## 2015-02-16 DIAGNOSIS — A084 Viral intestinal infection, unspecified: Secondary | ICD-10-CM | POA: Insufficient documentation

## 2015-02-16 DIAGNOSIS — G43909 Migraine, unspecified, not intractable, without status migrainosus: Secondary | ICD-10-CM | POA: Insufficient documentation

## 2015-02-16 DIAGNOSIS — F17210 Nicotine dependence, cigarettes, uncomplicated: Secondary | ICD-10-CM | POA: Diagnosis not present

## 2015-02-16 DIAGNOSIS — R519 Headache, unspecified: Secondary | ICD-10-CM

## 2015-02-16 DIAGNOSIS — R51 Headache: Secondary | ICD-10-CM

## 2015-02-16 DIAGNOSIS — R112 Nausea with vomiting, unspecified: Secondary | ICD-10-CM | POA: Diagnosis present

## 2015-02-16 LAB — BASIC METABOLIC PANEL
Anion gap: 9 (ref 5–15)
BUN: 18 mg/dL (ref 6–20)
CHLORIDE: 105 mmol/L (ref 101–111)
CO2: 20 mmol/L — AB (ref 22–32)
CREATININE: 1.15 mg/dL (ref 0.61–1.24)
Calcium: 8.8 mg/dL — ABNORMAL LOW (ref 8.9–10.3)
GFR calc Af Amer: >60 mL/min (ref >60)
GFR calc non Af Amer: >60 mL/min (ref >60)
Glucose, Bld: 130 mg/dL — ABNORMAL HIGH (ref 65–99)
Potassium: 3.1 mmol/L — ABNORMAL LOW (ref 3.5–5.1)
SODIUM: 134 mmol/L — AB (ref 135–145)

## 2015-02-16 LAB — CBC WITH DIFFERENTIAL/PLATELET
Basophils Absolute: 0 10*3/uL (ref 0.0–0.1)
Basophils Relative: 0 %
EOS ABS: 0 10*3/uL (ref 0.0–0.7)
Eosinophils Relative: 0 %
HEMATOCRIT: 45.1 % (ref 39.0–52.0)
HEMOGLOBIN: 15.9 g/dL (ref 13.0–17.0)
Lymphocytes Relative: 4 %
Lymphs Abs: 0.5 10*3/uL — ABNORMAL LOW (ref 0.7–4.0)
MCH: 31 pg (ref 26.0–34.0)
MCHC: 35.3 g/dL (ref 30.0–36.0)
MCV: 87.9 fL (ref 78.0–100.0)
MONOS PCT: 5 %
Monocytes Absolute: 0.7 10*3/uL (ref 0.1–1.0)
NEUTROS ABS: 12 10*3/uL — AB (ref 1.7–7.7)
NEUTROS PCT: 91 %
Platelets: 236 10*3/uL (ref 150–400)
RBC: 5.13 MIL/uL (ref 4.22–5.81)
RDW: 12.4 % (ref 11.5–15.5)
WBC: 13.1 10*3/uL — AB (ref 4.0–10.5)

## 2015-02-16 MED ORDER — ONDANSETRON HCL 4 MG/2ML IJ SOLN
4.0000 mg | Freq: Once | INTRAMUSCULAR | Status: AC
  Administered 2015-02-16: 4 mg via INTRAVENOUS

## 2015-02-16 MED ORDER — SODIUM CHLORIDE 0.9 % IV BOLUS (SEPSIS)
1000.0000 mL | Freq: Once | INTRAVENOUS | Status: AC
  Administered 2015-02-16: 1000 mL via INTRAVENOUS

## 2015-02-16 MED ORDER — ONDANSETRON HCL 4 MG/2ML IJ SOLN
INTRAMUSCULAR | Status: AC
  Filled 2015-02-16: qty 2

## 2015-02-16 MED ORDER — DIPHENHYDRAMINE HCL 50 MG/ML IJ SOLN
25.0000 mg | Freq: Once | INTRAMUSCULAR | Status: AC
  Administered 2015-02-16: 25 mg via INTRAVENOUS
  Filled 2015-02-16: qty 1

## 2015-02-16 MED ORDER — ONDANSETRON HCL 8 MG PO TABS: 8.0000 mg | ORAL_TABLET | ORAL | Status: DC | PRN

## 2015-02-16 MED ORDER — KETOROLAC TROMETHAMINE 30 MG/ML IJ SOLN
30.0000 mg | Freq: Once | INTRAMUSCULAR | Status: AC
  Administered 2015-02-16: 30 mg via INTRAVENOUS
  Filled 2015-02-16: qty 1

## 2015-02-16 MED ORDER — HALOPERIDOL LACTATE 5 MG/ML IJ SOLN
2.0000 mg | Freq: Once | INTRAMUSCULAR | Status: AC
  Administered 2015-02-16: 2 mg via INTRAVENOUS
  Filled 2015-02-16: qty 1

## 2015-02-16 MED ORDER — METOCLOPRAMIDE HCL 5 MG/ML IJ SOLN
10.0000 mg | Freq: Once | INTRAMUSCULAR | Status: AC
  Administered 2015-02-16: 10 mg via INTRAVENOUS
  Filled 2015-02-16: qty 2

## 2015-02-16 NOTE — ED Notes (Signed)
Here from home with wife for NVD, fever, body aches and migraine HA. Onset yesterday at 1500. No meds PTA. V x4, D x3, denies obvious blood. Rates HA 8/10. PCP Dr. Anders Simmonds.N. Kevan NyGates. Sees Dr. Judie PetitM. Phillips for HA. 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

## 2015-02-16 NOTE — ED Notes (Signed)
Dr. Molpus into room 

## 2015-02-16 NOTE — ED Provider Notes (Signed)
CSN: [id number]     Arrival date & time 02/16/15  4403 History   None    Chief Complaint  Patient presents with  . N/V/D      (Consider location/radiation/quality/duration/timing/severity/associated sxs/prior Treatment) HPI  This is a 39 year old male with a history of migraines. He is here with nausea, vomiting and diarrhea since yesterday about 3 PM. He has had multiple episodes of emesis and watery stools. He has had a fever as well as generalized body aches. He states the illnesses triggered a migraine. He rates his pain as severe. It is located behind his eyes and is characterized as like prior migraines. It is associated with photophobia. He denies abdominal pain.  Past Medical History  Diagnosis Date  . Migraine headache    History reviewed. No pertinent past surgical history. History reviewed. No pertinent family history. Social History  Substance Use Topics  . Smoking status: Current Every Day Smoker -- 0.50 packs/day    Types: Cigarettes  . Smokeless tobacco: None  . Alcohol Use: No    Review of Systems  All other systems reviewed and are negative.   Allergies  Review of patient's allergies indicates no known allergies.  Home Medications   Prior to Admission medications   Medication Sig Start Date End Date Taking? Authorizing Provider  cyclobenzaprine (FLEXERIL) 10 MG tablet Take 1 tablet (10 mg total) by mouth 2 (two) times daily as needed for muscle spasms. 03/12/14   Mirian Mo, MD  DIAZEPAM PO Take by mouth.    Historical Provider, MD  Famotidine (PEPCID PO) Take by mouth.    Historical Provider, MD  oxyCODONE-acetaminophen (PERCOCET/ROXICET) 5-325 MG per tablet Take 1-2 tablets by mouth every 4 (four) hours as needed for severe pain. 03/12/14   Mirian Mo, MD  SUMAtriptan Succinate (IMITREX PO) Take by mouth.    Historical Provider, MD  Venlafaxine HCl (EFFEXOR PO) Take by mouth.    Historical Provider, MD   BP 117/79 mmHg  Pulse 90  Temp(Src)  100.2 F (37.9 C) (Oral)  Resp 24  Ht 6' (1.829 m)  Wt 200 lb (90.719 kg)  BMI 27.12 kg/m2  SpO2 95%   Physical Exam  General: Well-developed, well-nourished male in no acute distress; appearance consistent with age of record HENT: normocephalic; atraumatic Eyes: pupils equal, round and reactive to light; extraocular muscles intact; photophobia Neck: supple Heart: regular rate and rhythm; tachycardia Lungs: clear to auscultation bilaterally Abdomen: soft; nondistended; mild epigastric tenderness; no masses or hepatosplenomegaly; bowel sounds present Extremities: No deformity; full range of motion; pulses normal Neurologic: Awake, alert; motor function intact in all extremities and symmetric; no facial droop Skin: Warm and dry Psychiatric: Flat affect    ED Course  Procedures (including critical care time)   MDM  Nursing notes and vitals signs, including pulse oximetry, reviewed.  Summary of this visit's results, reviewed by myself:  Labs:  Results for orders placed or performed during the hospital encounter of 02/16/15 (from the past 24 hour(s))  CBC with Differential/Platelet     Status: Abnormal   Collection Time: 02/16/15  6:25 AM  Result Value Ref Range   WBC 13.1 (H) 4.0 - 10.5 K/uL   RBC 5.13 4.22 - 5.81 MIL/uL   Hemoglobin 15.9 13.0 - 17.0 g/dL   HCT 47.4 25.9 - 56.3 %   MCV 87.9 78.0 - 100.0 fL   MCH 31.0 26.0 - 34.0 pg   MCHC 35.3 30.0 - 36.0 g/dL   RDW 87.5 64.3 - 32.9 %  Platelets 236 150 - 400 K/uL   Neutrophils Relative % 91 %   Neutro Abs 12.0 (H) 1.7 - 7.7 K/uL   Lymphocytes Relative 4 %   Lymphs Abs 0.5 (L) 0.7 - 4.0 K/uL   Monocytes Relative 5 %   Monocytes Absolute 0.7 0.1 - 1.0 K/uL   Eosinophils Relative 0 %   Eosinophils Absolute 0.0 0.0 - 0.7 K/uL   Basophils Relative 0 %   Basophils Absolute 0.0 0.0 - 0.1 K/uL  Basic metabolic panel     Status: Abnormal   Collection Time: 02/16/15  6:25 AM  Result Value Ref Range   Sodium 134 (L) 135  - 145 mmol/L   Potassium 3.1 (L) 3.5 - 5.1 mmol/L   Chloride 105 101 - 111 mmol/L   CO2 20 (L) 22 - 32 mmol/L   Glucose, Bld 130 (H) 65 - 99 mg/dL   BUN 18 6 - 20 mg/dL   Creatinine, Ser 0.271.15 0.61 - 1.24 mg/dL   Calcium 8.8 (L) 8.9 - 10.3 mg/dL   GFR calc non Af Amer >60 >60 mL/min   GFR calc Af Amer >60 >60 mL/min   Anion gap 9 5 - 15   7:10 AM Patient's headache and nausea significantly improved after IV meds and fluids.    Paula LibraJohn Mariadelosang Wynns, MD 02/16/15 [phone]

## 2015-02-16 NOTE — ED Notes (Signed)
Dr[facility]

## 2015-09-24 ENCOUNTER — Emergency Department (HOSPITAL_COMMUNITY)
Admission: EM | Admit: 2015-09-24 | Discharge: 2015-09-24 | Disposition: A | Discharge status: Home / Self Care | Payer: Worker's Compensation | Attending: Emergency Medicine | Admitting: Emergency Medicine

## 2015-09-24 ENCOUNTER — Encounter (HOSPITAL_COMMUNITY): Payer: Self-pay

## 2015-09-24 DIAGNOSIS — F17210 Nicotine dependence, cigarettes, uncomplicated: Secondary | ICD-10-CM | POA: Diagnosis not present

## 2015-09-24 DIAGNOSIS — Z7721 Contact with and (suspected) exposure to potentially hazardous body fluids: Secondary | ICD-10-CM | POA: Insufficient documentation

## 2015-09-24 LAB — RAPID HIV SCREEN (HIV 1/2 AB+AG)
HIV 1/2 Antibodies: NONREACTIVE
HIV-1 P24 ANTIGEN - HIV24: NONREACTIVE

## 2015-09-24 NOTE — Discharge Instructions (Signed)
Go to [facility] [facility] [employer] today.

## 2015-09-24 NOTE — ED Notes (Signed)
Bed: WA05 Expected date:  Expected time:  Means of arrival:  Comments: 

## 2015-09-24 NOTE — ED Triage Notes (Signed)
Pt was starting an IV on a patient and pricked his thumb with the needle

## 2015-09-24 NOTE — ED Provider Notes (Signed)
[facility]-EMERGENCY DEPT Provider Note   CSN: [id number] Arrival date & time: 09/24/15  0540  First Provider Contact:  First MD Initiated Contact with Patient 09/24/15 [phone]        History   Chief Complaint Chief Complaint  Patient presents with  . Body Fluid Exposure    HPI Corey Gutierrez is a 40 y.o. male.  The history is provided by the patient.  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 The IV was 24-gauge. There was source patient's blood on the needle, and it did draw blood from his thumb. He states that he checked the patient's history and they have no known history of hepatitis or HIV.  Past Medical History:  Diagnosis Date  . Migraine headache     There are no active problems to display for this patient.   History reviewed. No pertinent surgical history.     Home Medications    Prior to Admission medications   Medication Sig Start Date End Date Taking? Authorizing Provider  cyclobenzaprine (FLEXERIL) 10 MG tablet Take 1 tablet (10 mg total) by mouth 2 (two) times daily as needed for muscle spasms. 03/12/14   Mirian Mo, MD  DIAZEPAM PO Take by mouth.    Historical Provider, MD  Famotidine (PEPCID PO) Take by mouth.    Historical Provider, MD  ondansetron (ZOFRAN) 8 MG tablet Take 1 tablet (8 mg total) by mouth every 4 (four) hours as needed for nausea or vomiting. 02/16/15   Paula Libra, MD  oxyCODONE-acetaminophen (PERCOCET/ROXICET) 5-325 MG per tablet Take 1-2 tablets by mouth every 4 (four) hours as needed for severe pain. 03/12/14   Mirian Mo, MD  SUMAtriptan Succinate (IMITREX PO) Take by mouth.    Historical Provider, MD  Venlafaxine HCl (EFFEXOR PO) Take by mouth.    Historical Provider, MD    Family History History reviewed. No pertinent family history.  Social History Social History  Substance Use Topics  . Smoking status: Current Every  Day Smoker    Packs/day: 0.50    Types: Cigarettes  . Smokeless tobacco: Current User  . Alcohol use No     Allergies   Review of patient's allergies indicates no known allergies.   Review of Systems Review of Systems  All other systems reviewed and are negative.    Physical Exam Updated Vital Signs BP (!) 139/101 (BP Location: Right Arm)   Pulse 78   Temp 98 F (36.7 C) (Oral)   Resp 18   SpO2 100%   Physical Exam  Nursing note and vitals reviewed.  40 year old male, resting comfortably and in no acute distress. Vital signs are significant for hypertension. Oxygen saturation is 100%, which is normal. Head is normocephalic and atraumatic. PERRLA, EOMI. Oropharynx is clear. Neck is nontender and supple without adenopathy or JVD. Back is nontender and there is no CVA tenderness. Lungs are clear without rales, wheezes, or rhonchi. Chest is nontender. Heart has regular rate and rhythm without murmur. Abdomen is soft, flat, nontender without masses or hepatosplenomegaly and peristalsis is normoactive. Extremities have no cyanosis or edema, full range of motion is present. Skin puncture site is identified on the left thumb, distal phalanx with no active bleeding. Skin is warm and dry without rash. Neurologic: Mental status is normal, cranial nerves are intact, there are no motor or sensory deficits.   ED Treatments /  Results  Labs (all labs ordered are listed, but only abnormal results are displayed) Labs Reviewed  RAPID HIV SCREEN (HIV 1/2 AB+AG)  HEPATITIS PANEL, ACUTE    Procedures Procedures (including critical care time)  Medications Ordered in ED Medications - No data to display   Initial Impression / Assessment and Plan / ED Course  I have reviewed the triage vital signs and the nursing notes.  Pertinent labs & imaging results that were available during my care of the patient were reviewed by me and considered in my medical decision making (see chart for  details).  Clinical Course   Needlestick exposure from patient with low risk of infective 80. Patient is referred to employee health for discussion about possible postexposure prophylaxis. Relative risk of HIV conversion was discussed with patient. Risk is higher since the needle was hollow and was grossly contaminated with blood, but risk is lower because patient does not have known HIV and it was a small gauge needle.  Final Clinical Impressions(s) / ED Diagnoses   Final diagnoses:  Exposure to blood or body fluid    New Prescriptions New Prescriptions   No medications on file     Dione Booze, MD 09/24/15 [phone]

## 2015-09-25 LAB — HEPATITIS PANEL, ACUTE
HCV Ab: <0.1 s/co ratio (ref 0.0–0.9)
HEP A IGM: NEGATIVE
HEP B C IGM: NEGATIVE
Hepatitis B Surface Ag: NEGATIVE

## 2015-09-29 ENCOUNTER — Telehealth: Payer: Self-pay

## 2015-09-29 NOTE — Telephone Encounter (Signed)
Pt has w/c emergent need to show documentation on last HEP B and/or titter immunizations.

## 2016-07-07 ENCOUNTER — Encounter (HOSPITAL_BASED_OUTPATIENT_CLINIC_OR_DEPARTMENT_OTHER): Payer: Self-pay | Admitting: Emergency Medicine

## 2016-07-07 ENCOUNTER — Emergency Department (HOSPITAL_BASED_OUTPATIENT_CLINIC_OR_DEPARTMENT_OTHER)
Admission: EM | Admit: 2016-07-07 | Discharge: 2016-07-07 | Disposition: A | Discharge status: Home / Self Care | Payer: 59 | Attending: Emergency Medicine | Admitting: Emergency Medicine

## 2016-07-07 DIAGNOSIS — F17210 Nicotine dependence, cigarettes, uncomplicated: Secondary | ICD-10-CM | POA: Diagnosis not present

## 2016-07-07 DIAGNOSIS — Z79899 Other long term (current) drug therapy: Secondary | ICD-10-CM | POA: Insufficient documentation

## 2016-07-07 DIAGNOSIS — R51 Headache: Secondary | ICD-10-CM | POA: Diagnosis present

## 2016-07-07 DIAGNOSIS — F17290 Nicotine dependence, other tobacco product, uncomplicated: Secondary | ICD-10-CM | POA: Insufficient documentation

## 2016-07-07 DIAGNOSIS — G43009 Migraine without aura, not intractable, without status migrainosus: Secondary | ICD-10-CM | POA: Diagnosis not present

## 2016-07-07 MED ORDER — KETOROLAC TROMETHAMINE 30 MG/ML IJ SOLN
15.0000 mg | Freq: Once | INTRAMUSCULAR | Status: AC
  Administered 2016-07-07: 15 mg via INTRAVENOUS
  Filled 2016-07-07: qty 1

## 2016-07-07 MED ORDER — SODIUM CHLORIDE 0.9 % IV BOLUS (SEPSIS)
1000.0000 mL | Freq: Once | INTRAVENOUS | Status: AC
  Administered 2016-07-07: 1000 mL via INTRAVENOUS

## 2016-07-07 MED ORDER — DEXAMETHASONE SODIUM PHOSPHATE 10 MG/ML IJ SOLN
10.0000 mg | Freq: Once | INTRAMUSCULAR | Status: AC
  Administered 2016-07-07: 10 mg via INTRAVENOUS
  Filled 2016-07-07: qty 1

## 2016-07-07 MED ORDER — DIPHENHYDRAMINE HCL 50 MG/ML IJ SOLN
25.0000 mg | Freq: Once | INTRAMUSCULAR | Status: AC
  Administered 2016-07-07: 25 mg via INTRAVENOUS
  Filled 2016-07-07: qty 1

## 2016-07-07 MED ORDER — PROCHLORPERAZINE EDISYLATE 5 MG/ML IJ SOLN
10.0000 mg | Freq: Once | INTRAMUSCULAR | Status: AC
  Administered 2016-07-07: 10 mg via INTRAVENOUS
  Filled 2016-07-07: qty 2

## 2016-07-07 NOTE — ED Notes (Signed)
DC instructions along with work note provided

## 2016-07-07 NOTE — ED Provider Notes (Signed)
[facility] Provider Note   CSN: [id number] Arrival date & time: 07/07/16  4098     History   Chief Complaint Chief Complaint  Patient presents with  . Migraine    HPI Corey Gutierrez is a 41 y.o. male.  41 yo M with a chief complaint of a headache. Feels typical of his migraines. Left-sided behind his eye, worse with bright lights and loud noises. Having some nausea and vomiting with it as well. Feels similar but more severe than typical. He states he usually gets these a couple times here where he has to come to the emergency department for rescue therapy. Denies fevers denies neck pain. Denies unilateral weakness or numbness.   The history is provided by the patient.  Migraine  This is a recurrent problem. The current episode started 6 to 12 hours ago. The problem occurs constantly. The problem has been gradually worsening. Associated symptoms include headaches. Pertinent negatives include no chest pain, no abdominal pain and no shortness of breath. Exacerbated by: bright lights, loud noises. The symptoms are relieved by lying down and rest. Treatments tried: sumatriptan. The treatment provided no relief.    Past Medical History:  Diagnosis Date  . Migraine headache     There are no active problems to display for this patient.   No past surgical history on file.     Home Medications    Prior to Admission medications   Medication Sig Start Date End Date Taking? Authorizing Provider  Famotidine (PEPCID PO) Take by mouth.   Yes [provider]  oxyCODONE-acetaminophen (PERCOCET/ROXICET) 5-325 MG per tablet Take 1-2 tablets by mouth every 4 (four) hours as needed for severe pain. 03/12/14  Yes Mirian Mo, MD  SUMAtriptan Succinate (IMITREX PO) Take by mouth.   Yes [provider]  Venlafaxine HCl (EFFEXOR PO) Take by mouth.   Yes [provider]    Family History No family history on file.  Social History Social History    Substance Use Topics  . Smoking status: Current Every Day Smoker    Packs/day: 0.50    Types: Cigarettes  . Smokeless tobacco: Current User  . Alcohol use No     Allergies   Patient has no known allergies.   Review of Systems Review of Systems  Constitutional: Negative for chills and fever.  HENT: Negative for congestion and facial swelling.   Eyes: Negative for discharge and visual disturbance.  Respiratory: Negative for shortness of breath.   Cardiovascular: Negative for chest pain and palpitations.  Gastrointestinal: Positive for nausea and vomiting. Negative for abdominal pain and diarrhea.  Musculoskeletal: Negative for arthralgias and myalgias.  Skin: Negative for color change and rash.  Neurological: Positive for headaches. Negative for tremors and syncope.  Psychiatric/Behavioral: Negative for confusion and dysphoric mood.     Physical Exam Updated Vital Signs BP 131/76 (BP Location: Right Arm)   Pulse 60   Temp 98.3 F (36.8 C) (Oral)   Resp 14   Ht 6' (1.829 m)   Wt 195 lb (88.5 kg)   SpO2 98%   BMI 26.45 kg/m   Physical Exam  Constitutional: He is oriented to person, place, and time. He appears well-developed and well-nourished.  HENT:  Head: Normocephalic and atraumatic.  Eyes: EOM are normal. Pupils are equal, round, and reactive to light.  Neck: Normal range of motion. Neck supple. No JVD present.  Cardiovascular: Normal rate and regular rhythm.  Exam reveals no gallop and no friction rub.  No murmur heard. Pulmonary/Chest: No respiratory distress. He has no wheezes.  Abdominal: He exhibits no distension and no mass. There is no tenderness. There is no rebound and no guarding.  Musculoskeletal: Normal range of motion.  Neurological: He is alert and oriented to person, place, and time. He has normal strength. No cranial nerve deficit or sensory deficit. Coordination normal. GCS eye subscore is 4. GCS verbal subscore is 5. GCS motor subscore is 6.   Skin: No rash noted. No pallor.  Psychiatric: He has a normal mood and affect. His behavior is normal.  Nursing note and vitals reviewed.    ED Treatments / Results  Labs (all labs ordered are listed, but only abnormal results are displayed) Labs Reviewed - No data to display  EKG  EKG Interpretation None       Radiology No results found.  Procedures Procedures (including critical care time)  Medications Ordered in ED Medications  prochlorperazine (COMPAZINE) injection 10 mg (10 mg Intravenous Given 07/07/16 1024)  diphenhydrAMINE (BENADRYL) injection 25 mg (25 mg Intravenous Given 07/07/16 1024)  ketorolac (TORADOL) 30 MG/ML injection 15 mg (15 mg Intravenous Given 07/07/16 1024)  dexamethasone (DECADRON) injection 10 mg (10 mg Intravenous Given 07/07/16 1024)  sodium chloride 0.9 % bolus 1,000 mL (0 mLs Intravenous Stopped 07/07/16 1101)     Initial Impression / Assessment and Plan / ED Course  I have reviewed the triage vital signs and the nursing notes.  Pertinent labs & imaging results that were available during my care of the patient were reviewed by me and considered in my medical decision making (see chart for details).     41 yo M With a chief complaint of a headache. Similar to his past migraines. We'll give a headache cocktail and reassess. Patient feeling better on reassessment. Requesting discharge. Follow-up with his pain clinic who is managing his headaches.  12:03 PM:  I have discussed the diagnosis/risks/treatment options with the patient and family and believe the pt to be eligible for discharge home to follow-up with Pain managment. We also discussed returning to the ED immediately if new or worsening sx occur. We discussed the sx which are most concerning (e.g., sudden worsening pain, fever, inability to tolerate by mouth) that necessitate immediate return. Medications administered to the patient during their visit and any new prescriptions provided to the  patient are listed below.  Medications given during this visit Medications  prochlorperazine (COMPAZINE) injection 10 mg (10 mg Intravenous Given 07/07/16 1024)  diphenhydrAMINE (BENADRYL) injection 25 mg (25 mg Intravenous Given 07/07/16 1024)  ketorolac (TORADOL) 30 MG/ML injection 15 mg (15 mg Intravenous Given 07/07/16 1024)  dexamethasone (DECADRON) injection 10 mg (10 mg Intravenous Given 07/07/16 1024)  sodium chloride 0.9 % bolus 1,000 mL (0 mLs Intravenous Stopped 07/07/16 1101)     The patient appears reasonably screen and/or stabilized for discharge and I doubt any other medical condition or other [facility] requiring further screening, evaluation, or treatment in the ED at this time prior to discharge.    Final Clinical Impressions(s) / ED Diagnoses   Final diagnoses:  Migraine without aura and without status migrainosus, not intractable    New Prescriptions Discharge Medication List as of 07/07/2016 11:17 AM       Melene PlanFloyd, Whitt Auletta, DO 07/07/16 1203

## 2016-07-07 NOTE — ED Triage Notes (Signed)
Pt having typical migraine.  Pain over left eye, light sensitivity, n/v since 4 am.

## 2019-01-08 ENCOUNTER — Emergency Department (HOSPITAL_BASED_OUTPATIENT_CLINIC_OR_DEPARTMENT_OTHER)
Admission: EM | Admit: 2019-01-08 | Discharge: 2019-01-08 | Disposition: A | Discharge status: Home / Self Care | Payer: 59 | Attending: Emergency Medicine | Admitting: Emergency Medicine

## 2019-01-08 ENCOUNTER — Other Ambulatory Visit: Payer: Self-pay

## 2019-01-08 ENCOUNTER — Encounter (HOSPITAL_BASED_OUTPATIENT_CLINIC_OR_DEPARTMENT_OTHER): Payer: Self-pay | Admitting: *Deleted

## 2019-01-08 DIAGNOSIS — Z79899 Other long term (current) drug therapy: Secondary | ICD-10-CM | POA: Diagnosis not present

## 2019-01-08 DIAGNOSIS — R202 Paresthesia of skin: Secondary | ICD-10-CM | POA: Diagnosis not present

## 2019-01-08 DIAGNOSIS — F17210 Nicotine dependence, cigarettes, uncomplicated: Secondary | ICD-10-CM | POA: Diagnosis not present

## 2019-01-08 DIAGNOSIS — M25512 Pain in left shoulder: Secondary | ICD-10-CM | POA: Diagnosis present

## 2019-01-08 DIAGNOSIS — M62838 Other muscle spasm: Secondary | ICD-10-CM | POA: Diagnosis not present

## 2019-01-08 MED ORDER — NAPROXEN 500 MG PO TABS: 500.0000 mg | ORAL_TABLET | Freq: Two times a day (BID) | ORAL | 0 refills | Status: AC

## 2019-01-08 MED ORDER — KETOROLAC TROMETHAMINE 30 MG/ML IJ SOLN
30.0000 mg | Freq: Once | INTRAMUSCULAR | Status: AC
  Administered 2019-01-08: 30 mg via INTRAMUSCULAR
  Filled 2019-01-08: qty 1

## 2019-01-08 MED ORDER — DIAZEPAM 5 MG PO TABS
5.0000 mg | ORAL_TABLET | Freq: Once | ORAL | Status: AC
Start: 2019-01-08 — End: 2019-01-08
  Administered 2019-01-08: 5 mg via ORAL
  Filled 2019-01-08: qty 1

## 2019-01-08 MED ORDER — CYCLOBENZAPRINE HCL 5 MG PO TABS: 5.0000 mg | ORAL_TABLET | Freq: Two times a day (BID) | ORAL | 0 refills | Status: AC | PRN

## 2019-01-08 NOTE — ED Provider Notes (Signed)
[facility] EMERGENCY DEPARTMENT Provider Note   CSN: [id number] Arrival date & time: 01/08/19  0242     History   Chief Complaint Chief Complaint  Patient presents with   Back Pain    HPI Corey Gutierrez is a 43 y.o. male.     HPI  This a 43 year old male who presents with left shoulder neck pain.  Patient reports that he woke up Sunday morning with left shoulder neck pain.  It became progressively worse throughout the day.  It radiates into his upper arm and neck.  It is worse with some movements.  Patient reports tingling in his fingers.  He denies any injury.  He states he does normally sleep on that side.  Denies any weakness.  No fevers.  Patient has taken hydrocodone at home with minimal relief.  He rates his pain 8 out of 10.  He denies chest pain, shortness of breath, upper respiratory symptoms.  Past Medical History:  Diagnosis Date   Migraine headache     There are no active problems to display for this patient.   History reviewed. No pertinent surgical history.      Home Medications    Prior to Admission medications   Medication Sig Start Date End Date Taking? Authorizing Provider  cyclobenzaprine (FLEXERIL) 5 MG tablet Take 1 tablet (5 mg total) by mouth 2 (two) times daily as needed. 01/08/19   Aleane Wesenberg, Mayer Masker, MD  Famotidine (PEPCID PO) Take by mouth.    [provider]  naproxen (NAPROSYN) 500 MG tablet Take 1 tablet (500 mg total) by mouth 2 (two) times daily. 01/08/19   Mehr Depaoli, Mayer Masker, MD  oxyCODONE-acetaminophen (PERCOCET/ROXICET) 5-325 MG per tablet Take 1-2 tablets by mouth every 4 (four) hours as needed for severe pain. 03/12/14   Mirian Mo, MD  SUMAtriptan Succinate (IMITREX PO) Take by mouth.    [provider]  Venlafaxine HCl (EFFEXOR PO) Take by mouth.    [provider]    Family History History reviewed. No pertinent family history.  Social History Social History   Tobacco Use   Smoking  status: Current Every Day Smoker    Packs/day: 0.50    Types: Cigarettes   Smokeless tobacco: Current User  Substance Use Topics   Alcohol use: No   Drug use: No     Allergies   Patient has no known allergies.   Review of Systems Review of Systems  Constitutional: Negative for fever.  Respiratory: Negative for shortness of breath.   Cardiovascular: Negative for chest pain.  Musculoskeletal:       Left shoulder neck pain  Neurological: Positive for numbness. Negative for weakness.  All other systems reviewed and are negative.    Physical Exam Updated Vital Signs BP (!) 144/102 (BP Location: Left Arm)    Pulse 88    Temp 97.8 F (36.6 C) (Oral)    Resp 18    Ht 1.829 m (6')    Wt 86.2 kg    SpO2 98%    BMI 25.77 kg/m   Physical Exam Vitals signs and nursing note reviewed.  Constitutional:      Appearance: He is well-developed. He is not toxic-appearing.  HENT:     Head: Normocephalic and atraumatic.     Mouth/Throat:     Mouth: Mucous membranes are moist.  Neck:     Musculoskeletal: Normal range of motion and neck supple. No neck rigidity.  Cardiovascular:     Rate and Rhythm: Normal  rate and regular rhythm.     Heart sounds: Normal heart sounds.  Pulmonary:     Effort: Pulmonary effort is normal. No respiratory distress.     Breath sounds: Normal breath sounds. No wheezing.  Musculoskeletal:        General: No deformity.     Comments: Normal range of motion of left shoulder, tenderness palpation over the upper trapezius with spasm noted, no torticollis, no obvious deformities, 2+ radial pulse  Skin:    General: Skin is warm and dry.  Neurological:     Mental Status: He is alert and oriented to person, place, and time.     Comments: 5 out of 5 grip strength, biceps, triceps, deltoid strength bilaterally  Psychiatric:        Mood and Affect: Mood normal.      ED Treatments / Results  Labs (all labs ordered are listed, but only abnormal results are  displayed) Labs Reviewed - No data to display  EKG None  Radiology No results found.  Procedures Procedures (including critical care time)  Medications Ordered in ED Medications  ketorolac (TORADOL) 30 MG/ML injection 30 mg (30 mg Intramuscular Given 01/08/19 0312)  diazepam (VALIUM) tablet 5 mg (5 mg Oral Given 01/08/19 0310)     Initial Impression / Assessment and Plan / ED Course  I have reviewed the triage vital signs and the nursing notes.  Pertinent labs & imaging results that were available during my care of the patient were reviewed by me and considered in my medical decision making (see chart for details).        Patient presents with pain left neck and shoulder region.  He is overall nontoxic.  Atraumatic.  He does have significant tenderness to palpation and spasm noted on exam.  He is neurovascularly intact distally.  Patient was given Valium for muscle relaxant and Toradol for antipronation.  On recheck, he has had some improvement but still reports significant pain.  Discussed with him that I felt that we needed to work on minimizing spasming and inflammation.  Recommend scheduled naproxen.  He will be given a short course of Flexeril.  He already has a prescription for narcotics at home after reviewing the narcotics database.  Recommend heat as well.  After history, exam, and medical workup I feel the patient has been appropriately medically screened and is safe for discharge home. Pertinent diagnoses were discussed with the patient. Patient was given return precautions.   Final Clinical Impressions(s) / ED Diagnoses   Final diagnoses:  Muscle spasm of shoulder region    ED Discharge Orders         Ordered    cyclobenzaprine (FLEXERIL) 5 MG tablet  2 times daily PRN     01/08/19 0343    naproxen (NAPROSYN) 500 MG tablet  2 times daily     01/08/19 0343           Viki Carrera, Barbette Hair, MD 01/08/19 [phone]

## 2019-01-08 NOTE — ED Triage Notes (Signed)
Pt reports pain in left shoulder blade, into neck and down left arms.

## 2019-01-08 NOTE — Discharge Instructions (Addendum)
You were seen today for left shoulder and neck pain.  You have significant muscle spasm on exam.  Take medications as prescribed.  Do not drive while taking muscle relaxers.  Make sure to start anti-inflammatories such as naproxen and take for the next 3 to 5 days.  Apply heat as needed.

## 2019-11-09 ENCOUNTER — Other Ambulatory Visit: Payer: Self-pay | Admitting: Internal Medicine

## 2019-11-09 DIAGNOSIS — R3129 Other microscopic hematuria: Secondary | ICD-10-CM

## 2019-11-14 ENCOUNTER — Other Ambulatory Visit: Payer: 59

## 2020-10-02 ENCOUNTER — Emergency Department (HOSPITAL_COMMUNITY)
Admission: EM | Admit: 2020-10-02 | Discharge: 2020-10-02 | Disposition: A | Discharge status: Home / Self Care | Payer: 59 | Attending: Emergency Medicine | Admitting: Emergency Medicine

## 2020-10-02 ENCOUNTER — Encounter (HOSPITAL_COMMUNITY): Payer: Self-pay | Admitting: Emergency Medicine

## 2020-10-02 DIAGNOSIS — F17210 Nicotine dependence, cigarettes, uncomplicated: Secondary | ICD-10-CM | POA: Insufficient documentation

## 2020-10-02 DIAGNOSIS — U071 COVID-19: Secondary | ICD-10-CM | POA: Insufficient documentation

## 2020-10-02 DIAGNOSIS — E871 Hypo-osmolality and hyponatremia: Secondary | ICD-10-CM | POA: Diagnosis not present

## 2020-10-02 DIAGNOSIS — R000 Tachycardia, unspecified: Secondary | ICD-10-CM | POA: Diagnosis not present

## 2020-10-02 DIAGNOSIS — R739 Hyperglycemia, unspecified: Secondary | ICD-10-CM | POA: Insufficient documentation

## 2020-10-02 DIAGNOSIS — M791 Myalgia, unspecified site: Secondary | ICD-10-CM | POA: Diagnosis present

## 2020-10-02 LAB — CBC WITH DIFFERENTIAL/PLATELET
Abs Immature Granulocytes: 0.02 10*3/uL (ref 0.00–0.07)
Basophils Absolute: 0 10*3/uL (ref 0.0–0.1)
Basophils Relative: 0 %
Eosinophils Absolute: 0 10*3/uL (ref 0.0–0.5)
Eosinophils Relative: 0 %
HCT: 41.4 % (ref 39.0–52.0)
Hemoglobin: 13.8 g/dL (ref 13.0–17.0)
Immature Granulocytes: 0 %
Lymphocytes Relative: 4 %
Lymphs Abs: 0.2 10*3/uL — ABNORMAL LOW (ref 0.7–4.0)
MCH: 31.5 pg (ref 26.0–34.0)
MCHC: 33.3 g/dL (ref 30.0–36.0)
MCV: 94.5 fL (ref 80.0–100.0)
Monocytes Absolute: 0.6 10*3/uL (ref 0.1–1.0)
Monocytes Relative: 13 %
Neutro Abs: 4 10*3/uL (ref 1.7–7.7)
Neutrophils Relative %: 83 %
Platelets: 203 10*3/uL (ref 150–400)
RBC: 4.38 MIL/uL (ref 4.22–5.81)
RDW: 13 % (ref 11.5–15.5)
WBC: 4.9 10*3/uL (ref 4.0–10.5)
nRBC: 0 % (ref 0.0–0.2)

## 2020-10-02 LAB — RESP PANEL BY RT-PCR (FLU A&B, COVID) ARPGX2
Influenza A by PCR: NEGATIVE
Influenza B by PCR: NEGATIVE
SARS Coronavirus 2 by RT PCR: POSITIVE — AB

## 2020-10-02 LAB — BASIC METABOLIC PANEL
Anion gap: 9 (ref 5–15)
BUN: 8 mg/dL (ref 6–20)
CO2: 24 mmol/L (ref 22–32)
Calcium: 8.5 mg/dL — ABNORMAL LOW (ref 8.9–10.3)
Chloride: 100 mmol/L (ref 98–111)
Creatinine, Ser: 1.09 mg/dL (ref 0.61–1.24)
GFR, Estimated: >60 mL/min (ref >60)
Glucose, Bld: 108 mg/dL — ABNORMAL HIGH (ref 70–99)
Potassium: 3.7 mmol/L (ref 3.5–5.1)
Sodium: 133 mmol/L — ABNORMAL LOW (ref 135–145)

## 2020-10-02 MED ORDER — ACETAMINOPHEN 325 MG PO TABS
650.0000 mg | ORAL_TABLET | Freq: Once | ORAL | Status: AC | PRN
  Administered 2020-10-02: 650 mg via ORAL
  Filled 2020-10-02: qty 2

## 2020-10-02 MED ORDER — NIRMATRELVIR/RITONAVIR (PAXLOVID)TABLET: 3.0000 | ORAL_TABLET | Freq: Two times a day (BID) | ORAL | 0 refills | Status: AC

## 2020-10-02 NOTE — ED Provider Notes (Signed)
[facility] EMERGENCY DEPARTMENT Provider Note   CSN: [id number] Arrival date & time: 10/02/20  9622     History Chief Complaint  Patient presents with   Generalized Body Aches    Corey Gutierrez is a 45 y.o. male.  HPI  Patient with no significant medical history presents to the emergency room with chief complaint of URI-like symptoms.  Patient states this started yesterday, he endorses headaches, subjective fevers, chills, general body aches, and feeling fatigued.  He denies any chest pain, shortness of breath, abdominal pain, nausea, vomiting or diarrhea.  Patient states he still tolerating p.o.  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  He is not immunocompromise.  Has no complaints this time.  Past Medical History:  Diagnosis Date   Migraine headache     There are no problems to display for this patient.   No past surgical history on file.     No family history on file.  Social History   Tobacco Use   Smoking status: Every Day    Packs/day: 0.50    Types: Cigarettes   Smokeless tobacco: Current  Substance Use Topics   Alcohol use: No   Drug use: No    Home Medications Prior to Admission medications   Medication Sig Start Date End Date Taking? Authorizing Provider  cyclobenzaprine (FLEXERIL) 5 MG tablet Take 1 tablet (5 mg total) by mouth 2 (two) times daily as needed. 01/08/19   Horton, Mayer Masker, MD  Famotidine (PEPCID PO) Take by mouth.    [provider]  naproxen (NAPROSYN) 500 MG tablet Take 1 tablet (500 mg total) by mouth 2 (two) times daily. 01/08/19   Horton, Mayer Masker, MD  oxyCODONE-acetaminophen (PERCOCET/ROXICET) 5-325 MG per tablet Take 1-2 tablets by mouth every 4 (four) hours as needed for severe pain. 03/12/14   Mirian Mo, MD  SUMAtriptan Succinate (IMITREX PO) Take by mouth.    [provider]  Venlafaxine HCl (EFFEXOR PO) Take by mouth.    [provider]    Allergies    Patient has no known allergies.  Review of Systems   Review of Systems  Constitutional:  Positive for chills and fever.  HENT:  Negative for congestion and sore throat.   Respiratory:  Negative for cough and shortness of breath.   Cardiovascular:  Negative for chest pain.  Gastrointestinal:  Negative for abdominal pain, diarrhea, nausea and vomiting.  Genitourinary:  Negative for enuresis.  Musculoskeletal:  Positive for myalgias. Negative for back pain.  Skin:  Negative for rash.  Neurological:  Positive for headaches. Negative for dizziness.  Hematological:  Does not bruise/bleed easily.   Physical Exam Updated Vital Signs BP 124/73   Pulse (!) 102   Temp (!) 103.2 F (39.6 C) (Oral)   Resp (!) 32   SpO2 99%   Physical Exam Vitals and nursing note reviewed.  Constitutional:      General: He is not in acute distress.    Appearance: He is not ill-appearing.  HENT:     Head: Normocephalic and atraumatic.     Nose: No congestion.     Mouth/Throat:     Mouth: Mucous membranes are moist.     Pharynx: Oropharynx is clear. No oropharyngeal exudate or posterior oropharyngeal erythema.  Eyes:     Conjunctiva/sclera: Conjunctivae normal.  Cardiovascular:     Rate and Rhythm: Regular rhythm. Tachycardia present.     Pulses:  Normal pulses.     Heart sounds: No murmur heard.   No friction rub. No gallop.  Pulmonary:     Effort: No respiratory distress.     Breath sounds: No wheezing, rhonchi or rales.  Musculoskeletal:     Comments: 5 out of 5 strength,  full range of motion in upper and lower extremities.  Skin:    General: Skin is warm and dry.  Neurological:     Mental Status: He is alert and oriented to person, place, and time.     Comments: Cranial nerves II through XII grossly intact, patient have no difficulty word finding, no slurring of his words, no unilateral weakness present.  Psychiatric:        Mood and Affect: Mood normal.     ED Results / Procedures / Treatments   Labs (all labs ordered are listed, but only abnormal results are displayed) Labs Reviewed  RESP PANEL BY RT-PCR (FLU A&B, COVID) ARPGX2 - Abnormal; Notable for the following components:      Result Value   SARS Coronavirus 2 by RT PCR POSITIVE (*)    All other components within normal limits  BASIC METABOLIC PANEL - Abnormal; Notable for the following components:   Sodium 133 (*)    Glucose, Bld 108 (*)    Calcium 8.5 (*)    All other components within normal limits  CBC WITH DIFFERENTIAL/PLATELET - Abnormal; Notable for the following components:   Lymphs Abs 0.2 (*)    All other components within normal limits    EKG None  Radiology No results found.  Procedures Procedures   Medications Ordered in ED Medications  acetaminophen (TYLENOL) tablet 650 mg (650 mg Oral Given 10/02/20 5573)    ED Course  I have reviewed the triage vital signs and the nursing notes.  Pertinent labs & imaging results that were available during my care of the patient were reviewed by me and considered in my medical decision making (see chart for details).    MDM Rules/Calculators/A&P                          Initial impression-presented with URI-like symptoms.  He is noted to be tachycardic with a fever, suspect COVID, will provide patient with Tylenol, obtain basic lab work-up and reassess.  Work-up-CBC unremarkable, BMP shows slight hyponatremia 133, hyperglycemia 108, COVID-positive.  Reassessment-vital signs have improved since given Tylenol.  Rule out- Low suspicion for systemic infection as patient is nontoxic-appearing, patient was notably tachycardic with a fever but I suspect this is secondary due to patient being COVID-positive with a fever of 103 which would explain the elevated heart rates, vitals improved after Tylenol.  Low suspicion for pneumonia as lung sounds are clear bilaterally will defer imaging at this time.  I have low suspicion  for PE as patient denies pleuritic chest pain, shortness of breath, no pedal edema present my exam, he was noted be tachycardic but I suspect this is likely due to his fever as well as being COVID-positive, presentation consistent with URI.  low suspicion for strep throat as oropharynx was visualized, no erythema or exudates noted.  Low suspicion patient would need  hospitalized due to viral infection or Covid as vital signs reassuring, patient is not in respiratory distress.    Plan-  URI-like symptoms-likely due to COVID infection, will start patient on antiviral treatment.  Recommend he follows with post-COVID care.   Vital signs have remained stable, no  indication for hospital admission. Patient given at home care as well strict return precautions.  Patient verbalized that they understood agreed to said plan.  Final Clinical Impression(s) / ED Diagnoses Final diagnoses:  None    Rx / DC Orders ED Discharge Orders     None        Carroll Sage, PA-C 10/02/20 1301    Horton, Clabe Seal, DO 10/03/20 [phone]

## 2020-10-02 NOTE — Discharge Instructions (Signed)
You have covid, start you on the antiviral treatment please take as prescribed.  I recommend taking Tylenol for fever control and ibuprofen for pain control please follow dosing on the back of bottle.  I recommend staying hydrated and if you do not an appetite, I recommend soups as this will provide you with fluids and calories.    you are Covid positive you must self quarantine for 5 days starting on symptom onset, if at the end of those 5 days you are feeling better you may return back to school/work, if you continue to have symptoms you must self quarantine for additional 5 days.  I would like you to contact "post Covid care" as they will provide you with information how to manage your Covid   Come back to the emergency department if you develop chest pain, shortness of breath, severe abdominal pain, uncontrolled nausea, vomiting, diarrhea.

## 2020-10-02 NOTE — ED Provider Notes (Signed)
Emergency Medicine Provider Triage Evaluation Note  Roderick Calo , a 45 y.o. male  was evaluated in triage.  Pt complains of general body aches, headaches, not feeling well, started yesterday, his vaccine against COVID-19, is a [employer] has had recent exposures to COVID.  We will  Review of Systems  Positive: Headaches, body aches Negative: Chest pain, shortness of breath  Physical Exam  BP 124/73   Pulse (!) 102   Temp (!) 103.2 F (39.6 C) (Oral)   Resp (!) 32   SpO2 99%  Gen:   Awake, no distress   Resp:  Normal effort  MSK:   Moves extremities without difficulty  Other:    Medical Decision Making  Medically screening exam initiated at 9:34 AM.  Appropriate orders placed.  Garvey Westcott was informed that the remainder of the evaluation will be completed by another provider, this initial triage assessment does not replace that evaluation, and the importance of remaining in the ED until their evaluation is complete.  Presents with general body aches and headaches patient need further work-up.   Carroll Sage, PA-C 10/02/20 0935    Rozelle Logan, DO 10/03/20 [phone]

## 2020-10-02 NOTE — ED Triage Notes (Signed)
Patient complains of generalized body aches, chills, and headache since yesterday. Tempeture 103F in triage. Patient states he is vaccinated against COVID. Patient is alert, oriented, and in no apparent distress at this time.
